# Patient Record
Sex: Male | Born: 2000 | Race: Black or African American | Hispanic: No | Marital: Single | Smoking: Never smoker
Health system: Southern US, Community
[De-identification: ages and names within clinical notes are randomized; demographics above are authoritative.]

## PROBLEM LIST (undated history)

## (undated) DIAGNOSIS — L409 Psoriasis, unspecified: Secondary | ICD-10-CM

---
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
## 2022-06-05 ENCOUNTER — Ambulatory Visit
Admission: EM | Admit: 2022-06-05 | Discharge: 2022-06-05 | Disposition: A | Discharge status: Home / Self Care | Payer: Medicaid Other | Attending: Internal Medicine | Admitting: Internal Medicine

## 2022-06-05 DIAGNOSIS — Z7251 High risk heterosexual behavior: Secondary | ICD-10-CM | POA: Insufficient documentation

## 2022-06-05 NOTE — ED Provider Notes (Signed)
  [facility]  Note:  This document was prepared using Dragon voice recognition software and may include unintentional dictation errors.  MRN: [id number] DOB: May 15, 2000 DATE: 06/05/22   Subjective:  Chief Complaint:  Chief Complaint  Patient presents with   SEXUALLY TRANSMITTED DISEASE     HPI: Torres Seago is a 22 y.o. male presenting for STD testing. Patient is currently asymptomatic, but a previous partner tested positive for an STD. He states that she never told him what STD she tested positive for, but she feels that he gave it to her.  He states that she is his only partner and that they did have unprotected sex. Denies fever, nausea/vomiting, abdominal pain, dysuria, penile discharge, penile lesions. Presents NAD.  Prior to Admission medications   Not on File     No Known Allergies  History:   Past Medical History:  Diagnosis Date   Psoriasis      History reviewed. No pertinent surgical history.  History reviewed. No pertinent family history.  Social History   Tobacco Use   Smoking status: Never    Review of Systems  Constitutional:  Negative for fever.  Gastrointestinal:  Negative for abdominal pain, nausea and vomiting.  Genitourinary:  Negative for dysuria, genital sores, penile discharge and penile pain.     Objective:   Vitals: BP 110/75 (BP Location: Right Arm)   Pulse 70   Temp 98.4 F (36.9 C) (Oral)   Resp 18   SpO2 98%   Physical Exam Constitutional:      General: He is not in acute distress.    Appearance: Normal appearance. He is well-developed and normal weight. He is not ill-appearing or toxic-appearing.  HENT:     Head: Normocephalic and atraumatic.  Cardiovascular:     Rate and Rhythm: Normal rate and regular rhythm.     Heart sounds: Normal heart sounds.  Pulmonary:     Effort: Pulmonary effort is normal.     Breath sounds: Normal breath sounds.     Comments: Clear to auscultation bilaterally  Abdominal:      General: Bowel sounds are normal.     Palpations: Abdomen is soft.     Tenderness: There is no abdominal tenderness.  Skin:    General: Skin is warm and dry.  Neurological:     General: No focal deficit present.     Mental Status: He is alert.  Psychiatric:        Mood and Affect: Mood and affect normal.     Results:  Labs: No results found for this or any previous visit (from the past 24 hour(s)).  Radiology: No results found.   UC Course/Treatments:  Procedures: Procedures   Medications Ordered in UC: Medications - No data to display   Assessment and Plan :     ICD-10-CM   1. High risk sexual behavior, unspecified type  Z72.51      High risk sexual behavior: Afebrile, nontoxic-appearing, NAD. VSS. DDX includes but not limited to: HIV, RPR, gonorrhea, chlamydia, trichomoniasis Patient is currently asymptomatic. Reports unprotected sex. Cytology is pending.  Patient refused HIV and RPR testing at this time. Safe sex precautions advised.  Strict ED precautions were given and patient verbalized understanding.  ED Discharge Orders     None        PDMP not reviewed this encounter.     Cynda Acres, PA-C 06/05/22 1752

## 2022-06-05 NOTE — Discharge Instructions (Addendum)
Your swab was sent to the lab for further testing.  You will be called with results. You should avoid all sexual activity until you have been notified of all your results and have undergone any necessary treatment.  If you are positive, it is recommended that you inform all sexual partners so they can treat be treated as well before having sex again.   

## 2022-06-05 NOTE — ED Triage Notes (Signed)
Pt reports a recent partner told him she had been exposed to an STD. Did not specify. Pt denies any symptoms.

## 2022-06-08 ENCOUNTER — Telehealth (HOSPITAL_COMMUNITY): Payer: Self-pay | Admitting: Emergency Medicine

## 2022-06-08 LAB — CYTOLOGY, (ORAL, ANAL, URETHRAL) ANCILLARY ONLY
Chlamydia: NEGATIVE
Comment: NEGATIVE
Comment: NEGATIVE
Comment: NORMAL
Neisseria Gonorrhea: NEGATIVE
Trichomonas: POSITIVE — AB

## 2022-06-08 MED ORDER — METRONIDAZOLE 500 MG PO TABS: 2000.0000 mg | ORAL_TABLET | Freq: Once | ORAL | 0 refills | Status: AC
